# Patient Record
Sex: Male | Born: 1965 | Race: White | Hispanic: No | Smoking: Former smoker
Health system: Southern US, Community
[De-identification: ages and names within clinical notes are randomized; demographics above are authoritative.]

## PROBLEM LIST (undated history)

## (undated) DIAGNOSIS — F32A Depression, unspecified: Secondary | ICD-10-CM

## (undated) DIAGNOSIS — F909 Attention-deficit hyperactivity disorder, unspecified type: Secondary | ICD-10-CM

## (undated) DIAGNOSIS — F419 Anxiety disorder, unspecified: Secondary | ICD-10-CM

## (undated) HISTORY — DX: Anxiety disorder, unspecified: F41.9

## (undated) HISTORY — DX: Depression, unspecified: F32.A

## (undated) HISTORY — DX: Attention-deficit hyperactivity disorder, unspecified type: F90.9

---

## 2009-12-30 DIAGNOSIS — J309 Allergic rhinitis, unspecified: Secondary | ICD-10-CM | POA: Insufficient documentation

## 2011-12-07 DIAGNOSIS — H5210 Myopia, unspecified eye: Secondary | ICD-10-CM | POA: Insufficient documentation

## 2011-12-07 DIAGNOSIS — H524 Presbyopia: Secondary | ICD-10-CM | POA: Insufficient documentation

## 2012-02-10 DIAGNOSIS — G44209 Tension-type headache, unspecified, not intractable: Secondary | ICD-10-CM | POA: Insufficient documentation

## 2019-12-13 ENCOUNTER — Ambulatory Visit (INDEPENDENT_AMBULATORY_CARE_PROVIDER_SITE_OTHER): Payer: Self-pay

## 2020-08-08 ENCOUNTER — Encounter (INDEPENDENT_AMBULATORY_CARE_PROVIDER_SITE_OTHER): Payer: Self-pay | Admitting: Internal Medicine

## 2020-08-14 ENCOUNTER — Encounter (INDEPENDENT_AMBULATORY_CARE_PROVIDER_SITE_OTHER): Payer: Self-pay | Admitting: Internal Medicine

## 2020-08-14 DIAGNOSIS — F988 Other specified behavioral and emotional disorders with onset usually occurring in childhood and adolescence: Secondary | ICD-10-CM | POA: Insufficient documentation

## 2020-08-14 DIAGNOSIS — Z8739 Personal history of other diseases of the musculoskeletal system and connective tissue: Secondary | ICD-10-CM | POA: Insufficient documentation

## 2020-08-14 DIAGNOSIS — H04129 Dry eye syndrome of unspecified lacrimal gland: Secondary | ICD-10-CM | POA: Insufficient documentation

## 2020-08-14 DIAGNOSIS — M549 Dorsalgia, unspecified: Secondary | ICD-10-CM | POA: Insufficient documentation

## 2020-08-14 DIAGNOSIS — F32A Depression, unspecified: Secondary | ICD-10-CM | POA: Insufficient documentation

## 2020-08-16 ENCOUNTER — Ambulatory Visit (INDEPENDENT_AMBULATORY_CARE_PROVIDER_SITE_OTHER): Payer: BLUE CROSS/BLUE SHIELD | Admitting: Internal Medicine

## 2020-08-16 ENCOUNTER — Encounter (INDEPENDENT_AMBULATORY_CARE_PROVIDER_SITE_OTHER): Payer: Self-pay | Admitting: Internal Medicine

## 2020-08-16 VITALS — BP 122/80 | HR 73 | Temp 96.9°F | Resp 16 | Ht 68.0 in | Wt 143.0 lb

## 2020-08-16 DIAGNOSIS — Z1211 Encounter for screening for malignant neoplasm of colon: Secondary | ICD-10-CM

## 2020-08-16 DIAGNOSIS — Z0000 Encounter for general adult medical examination without abnormal findings: Secondary | ICD-10-CM

## 2020-08-16 DIAGNOSIS — Z125 Encounter for screening for malignant neoplasm of prostate: Secondary | ICD-10-CM

## 2020-08-16 DIAGNOSIS — M79671 Pain in right foot: Secondary | ICD-10-CM

## 2020-08-16 DIAGNOSIS — F419 Anxiety disorder, unspecified: Secondary | ICD-10-CM

## 2020-08-16 DIAGNOSIS — A630 Anogenital (venereal) warts: Secondary | ICD-10-CM

## 2020-08-16 DIAGNOSIS — F32A Depression, unspecified: Secondary | ICD-10-CM

## 2020-08-16 NOTE — Progress Notes (Signed)
Subjective:      Patient ID: Charles Davidson is a 54 y.o. male     Chief Complaint   Patient presents with    Foot Injury     right        HPI right foot eversion injury, took a wrong step in June which was about 3 to 4 months ago.  No immediate swelling noted but was having pain with weightbearing which has improved to 70 to 80%.  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  No fever or chills noted.  He wants to set up a colon cancer screening as his paternal grandmother had colon cancer.  Noted some nodule like growth in his perianal area for last few months they are painless.    The following sections were reviewed this encounter by the provider:   Tobacco   Allergies   Meds   Problems   Med Hx   Surg Hx   Fam Hx          Review of Systems   Constitutional: Negative.    HENT: Negative.    Eyes: Negative.    Respiratory: Negative.    Cardiovascular: Negative.    Gastrointestinal: Negative.    Endocrine: Negative.    Genitourinary: Negative.    Musculoskeletal: Negative.    Skin: Negative.    Neurological: Negative.    Psychiatric/Behavioral: Negative.           BP 122/80    Pulse 73    Temp (!) 96.9 F (36.1 C) (Tympanic)    Resp 16    Ht 1.727 m (5\' 8" )    Wt 64.9 kg (143 lb)    SpO2 97%    BMI 21.74 kg/m     Objective:     Physical Exam  Vitals reviewed.   Constitutional:       Appearance: Normal appearance.   HENT:      Head: Normocephalic and atraumatic.      Nose: Nose normal.      Mouth/Throat:      Mouth: Mucous membranes are moist.      Pharynx: Oropharynx is clear.   Eyes:      Extraocular Movements: Extraocular movements intact.      Conjunctiva/sclera: Conjunctivae normal.      Pupils: Pupils are equal, round, and reactive to light.   Cardiovascular:      Rate and Rhythm: Normal rate and regular rhythm.      Pulses: Normal pulses.      Heart sounds: Normal heart sounds.   Pulmonary:      Effort: Pulmonary effort is normal.      Breath sounds: Normal breath sounds.   Abdominal:       General: Bowel sounds are normal.      Palpations: Abdomen is soft.   Genitourinary:      Musculoskeletal:         General: Normal range of motion.      Cervical back: Normal range of motion and neck supple.      Right foot: Normal range of motion. No deformity, bunion or foot drop.        Feet:    Skin:     General: Skin is warm and dry.   Neurological:      General: No focal deficit present.      Mental Status: He is alert and oriented to person, place, and time. Mental status is at baseline.   Psychiatric:  Mood and Affect: Mood normal.         Behavior: Behavior normal.         Thought Content: Thought content normal.         Judgment: Judgment normal.          Assessment:     1. Colon cancer screening  - Ambulatory referral to Gastroenterology    2. Prostate cancer screening  - PROSTATE SPECIFIC ANTIGEN SCREEN; Future    3. Periodic health assessment, general screening, adult  - CBC and differential; Future  - Comprehensive metabolic panel  - Urinalysis with microscopic; Future  - Lipid panel; Future  - Hemoglobin A1C; Future    4. Anxiety and depression  - TSH; Future    5. Right foot pain  - Referral to Podiatry - EXTERNAL    6. Condyloma acuminatum  - Referral to Dermatology - EXTERNAL     Nonspecific right metatarsal area of discomfort basically resolved, needs to wear proper fitting and supportive shoes with good arch support.  Can benefit from podiatry evaluation if pain persist for next 3 months.  Anxiety and depression controlled with lifestyle changes and change of job.  Benign-appearing small polypoid viral warts in the perianal area will benefit from dermatology intervention.  Family history of colon cancer will benefit from colon cancer screening with colonoscopy.      Plan:     Podiatry evaluation as needed  Supportive shoes with good arch support.  Dermatology evaluation  Colon cancer screening  Schedule a full physical  Lab requested for physical.    Lewis Keats Jennell Corner, MD

## 2020-08-16 NOTE — Addendum Note (Signed)
Addended by: Fabio Neighbors PRAKASH on: 08/16/2020 11:18 AM     Modules accepted: Orders

## 2020-08-16 NOTE — Patient Instructions (Signed)
Podiatry evaluation as needed  Supportive shoes with good arch support.  Dermatology evaluation  Colon cancer screening  Schedule a full physical  Lab requested for physical.

## 2020-10-07 ENCOUNTER — Encounter (INDEPENDENT_AMBULATORY_CARE_PROVIDER_SITE_OTHER): Payer: Self-pay | Admitting: Internal Medicine

## 2020-10-07 ENCOUNTER — Ambulatory Visit (INDEPENDENT_AMBULATORY_CARE_PROVIDER_SITE_OTHER): Payer: BLUE CROSS/BLUE SHIELD | Admitting: Internal Medicine

## 2020-10-07 VITALS — BP 120/78 | HR 64 | Temp 97.9°F | Resp 14 | Ht 68.0 in | Wt 143.0 lb

## 2020-10-07 DIAGNOSIS — F988 Other specified behavioral and emotional disorders with onset usually occurring in childhood and adolescence: Secondary | ICD-10-CM

## 2020-10-07 DIAGNOSIS — Z23 Encounter for immunization: Secondary | ICD-10-CM

## 2020-10-07 DIAGNOSIS — Z0000 Encounter for general adult medical examination without abnormal findings: Secondary | ICD-10-CM

## 2020-10-07 DIAGNOSIS — J301 Allergic rhinitis due to pollen: Secondary | ICD-10-CM

## 2020-10-07 DIAGNOSIS — F419 Anxiety disorder, unspecified: Secondary | ICD-10-CM

## 2020-10-07 DIAGNOSIS — F32A Depression, unspecified: Secondary | ICD-10-CM

## 2020-10-07 DIAGNOSIS — Z125 Encounter for screening for malignant neoplasm of prostate: Secondary | ICD-10-CM

## 2020-10-07 NOTE — Patient Instructions (Signed)
Avoid lifting anything more than 15 pounds for 2 to 3 weeks.  Low sodium and low cholesterol diet  Exercise 3 times per week for 30 minutes  Follow up in 1 year      Shoulder and Arm Exercises: Elbow Extension/Triceps Press    This exercise stretches and strengthens your shoulders and triceps, the muscles in the back of your upper arm. Before starting this exercise, talk with your healthcare provider and read through all the instructions. During the exercise, breathe normally and use smooth movements. Stop if you feel any pain. If pain persists, call your healthcare provider.   Grasp a___ pound weight in each hand. Raise one arm overhead. Hold that arm close to your ear. Bend your elbow and lower the weight behind your head, as far as you can, being careful not to hit your head.Ask your physical therapist or healthcare provider how much weight to use.   Slowly straighten your elbow, extending your arm upward. Return to starting position.   Repeat ____ times with each arm. Do ____ sets ____ times a day.  Caution  Keep your head still and neck straight. Keep your arm close to your ear. Dont arch your back.  [employer] last reviewed this educational content on 05/03/2019   2000-2021 [employer], [place]. All rights reserved. This information is not intended as a substitute for professional medical care. Always follow your healthcare professional's instructions.

## 2020-10-07 NOTE — Progress Notes (Signed)
[facility] INTERNAL MEDICINE - [facility] [place] [facility]                  Date of Exam: 10/07/2020 11:03 AM        Patient ID: Charles Davidson is a 54 y.o. male.        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  Felt some pain in his left tricep area for a while, able to do all activities.         Problem List:    Patient Active Problem List   Diagnosis    Anxiety and depression    Attention deficit disorder (ADD) in adult    Dry eye    History of gout    Trigger point with back pain    Allergic rhinitis    Myopia    Presbyopia    Tension type headache             Current Meds:    No outpatient medications have been marked as taking for the 10/07/20 encounter (Office Visit) with Kaleen Odea, MD.          Allergies:    No Known Allergies          Past Surgical History:    Past Surgical History:   Procedure Laterality Date    MOUTH SURGERY  12/2019    replace bridge           Family History:    Family History   Problem Relation Age of Onset    Prostate cancer Father     Colon cancer Maternal Grandmother     Diabetes Paternal Grandmother     Heart attack Paternal Grandfather            Social History:    Social History     Socioeconomic History    Marital status: Divorced     Spouse name: None    Number of children: None    Years of education: None    Highest education level: None   Occupational History    None   Tobacco Use    Smoking  status: Former Smoker     Years: 8.00     Types: Cigarettes     Quit date: 2002     Years since quitting: 19.9    Smokeless tobacco: Never Used    Tobacco comment: 8 years 2-3 cigs/day   Vaping Use    Vaping Use: Never used   Substance and Sexual Activity    Alcohol use: Yes     Alcohol/week: 5.0 standard drinks     Types: 5 Glasses of wine per week    Drug use: Never    Sexual activity: Yes     Partners: Female     Birth control/protection: Condom   Other Topics Concern  None   Social History Narrative    Nutrition:  Well balanced diet    Caffeine: coffee , tea   and 1 /day    Seat  Belt use:  Yes    Smoke Dectector in the home:  Yes    Pets:  none                     Social Determinants of Psychologist, prison and probation services Strain:     Difficulty of Paying Living Expenses: Not on file   Food Insecurity:     Worried About Programme researcher, broadcasting/film/video in the Last Year: Not on file    [employer] of Food in the Last Year: Not on file   Transportation Needs:     Lack of Transportation (Medical): Not on file    Lack of Transportation (Non-Medical): Not on file   Physical Activity: Unknown    Days of Exercise per Week: 1 day    Minutes of Exercise per Session: Not on file   Stress:     Feeling of Stress : Not on file   Social Connections:     Frequency of Communication with Friends and Family: Not on file    Frequency of Social Gatherings with Friends and Family: Not on file    Attends Religious Services: Not on file    Active Member of Clubs or Organizations: Not on file    Attends Banker Meetings: Not on file    Marital Status: Not on file   Intimate Partner Violence:     Fear of Current or Ex-Partner: Not on file    Emotionally Abused: Not on file    Physically Abused: Not on file    Sexually Abused: Not on file   Housing Stability:     Unable to Pay for Housing in the Last Year: Not on file    Number of Places Lived in the Last Year: Not on file    Unstable Housing in the Last Year: Not on  file           The following sections were reviewed this encounter by the provider:   Tobacco   Allergies   Meds   Problems   Med Hx   Surg Hx   Fam Hx              Vital Signs:    BP 120/78    Pulse 64    Temp 97.9 F (36.6 C) (Tympanic)    Resp 14    Ht 1.727 m (5\' 8" )    Wt 64.9 kg (143 lb)    SpO2 97%    BMI 21.74 kg/m          ROS:    Review of Systems     General/Constitutional:           Denies Chills.  Denies Fatigue.  Denies Fever.       Ophthalmologic:           Denies Blurred vision.       ENT:           Denies Nasal Discharge.  Denies Sinus pain.  Denies Sore throat.       Endocrine:           Denies Polydipsia.  Denies Polyuria.       Respiratory:           Denies Cough.  Denies Orthopnea.  Denies Shortness of breath.  Denies Wheezing.       Cardiovascular:           Denies Chest pain.  Denies Chest pain with exertion.  Denies Palpitations.  Denies Swelling in hands/feet.       Gastrointestinal:           Denies Abdominal pain.  Denies Constipation.  Denies Diarrhea.  Denies Nausea.  Denies Vomiting.       Hematology:           Denies Easy bruising.  Denies Easy Bleeding.       Genitourinary:           Denies Blood in urine.  Denies Painful urination.       Peripheral Vascular:           Denies Pain/cramping in legs after exertion.  Denies Painful extremities.       Skin:           Denies Rash.       Neurologic:           Denies Dizziness.  Denies Pre-Syncope.  Denies Tingling/Numbness.       Psychiatric:           Denies Anxiety.  Denies Depressed mood.               Physical Exam:    Physical Exam   GENERAL APPEARANCE: alert, in no acute distress, well developed, well nourished, oriented to time, place, and person.   HEAD: normal appearance.   EYES: extraocular movement intact (EOMI), pupils equal, round, reactive to light and accommodation, sclera anicteric.   EARS: tympanic membranes normal bilaterally, external canals normal .   NOSE: normal nasal mucosa, nares patent.   ORAL CAVITY: normal  oropharynx.   THROAT: no erythema, no exudate.   NECK/THYROID: neck supple, carotid pulse 2+ bilaterally, no lymphadenopathy, no thyromegaly.   SKIN: no rashes.   HEART: S1, S2 normal, no murmurs, rubs, gallops, regular rate and rhythm.   LUNGS: normal effort / no distress, normal breath sounds, clear to auscultation bilaterally, no wheezes, rales, rhonchi.   ABDOMEN: bowel sounds present, no hepatosplenomegaly, soft, nontender, nondistended.   EXTREMITIES: no clubbing, cyanosis, or edema.   PERIPHERAL PULSES: 2+ dorsalis pedis, 2+ posterior tibial.   NEUROLOGIC: nonfocal, cranial nerves 2-12 grossly intact, deep tendon reflexes 2+ symmetrical, normal strength, tone and reflexes, sensory exam intact.   PSYCH: alert, oriented, cognitive function intact.  Genital exam is normal  Prostate exam is normal  Stool guaac is negative  Small polypoid-like lesions in the perianal area.            Assessment/Plan:    Health Maintenance:  Recommend optimizing low carbohydrate diet efforts and obtaining at least 150 minutes of aerobic exercise per week. Recommend 20-25 grams of dietary fiber daily. Recommend drinking at least 60-80 ounces of water per day. Recommend optimizing low sodium diet measures ( less than 2 grams of sodium in the diet per day ). Recommend obtaining a Shingles vaccination.  Vision screening UTD. Dental Screening UTD. Colonoscopy is due.  GI consult for colonoscopy ordered. Prostate cancer screening is due.  PSA ordered.  Problem List Items Addressed This Visit     Anxiety and depression    Attention deficit disorder (ADD) in adult    Allergic rhinitis      Other Visit Diagnoses     Periodic health assessment, general screening, adult    -  Primary    Relevant Orders  CBC and differential    Comprehensive metabolic panel    Urinalysis with microscopic    Lipid panel    Hemoglobin A1C    Prostate cancer screening        Relevant Orders    PROSTATE SPECIFIC ANTIGEN SCREEN    Need for immunization against  influenza        Relevant Orders    Flu vaccine QUADRIVALENT (PF) 6 months and older (FLULAVAL/FLUARIX)    Need for zoster vaccination        Relevant Orders    Zoster Vaccine Recomb,Adjuvanted (IM)    Need for immunization against tetanus alone        Relevant Orders    Tdap vaccine greater than or equal to 7yo IM      ADHD, anxiety and depression are fairly controlled with lifestyle modification and exercise.  Left triceps strain will benefit from stretch exercises and avoidance of heavy lifting or using mechanical equipment for at least 3 to 4 weeks.  Perianal condyloma seen by dermatology prescribed topical medicine.  Colon cancer screening referral provided during the last visit.  He wants to come back for Shingrix and Tdap vaccination.    Plan  Avoid lifting anything more than 15 pounds for 2 to 3 weeks.  Low sodium and low cholesterol diet  Exercise 3 times per week for 30 minutes  Follow up in 1 year                Follow-up:    Return in about 1 year (around 10/07/2021), or if symptoms worsen or fail to improve.         Jomayra Novitsky Jennell Corner, MD

## 2020-10-08 LAB — CBC AND DIFFERENTIAL
Baso(Absolute): 0 10*3/uL (ref 0.0–0.2)
Basos: 1 %
Eos: 2 %
Eosinophils Absolute: 0.1 10*3/uL (ref 0.0–0.4)
Hematocrit: 51.5 % — ABNORMAL HIGH (ref 37.5–51.0)
Hemoglobin: 17.2 g/dL (ref 13.0–17.7)
Immature Granulocytes Absolute: 0 10*3/uL (ref 0.0–0.1)
Immature Granulocytes: 0 %
Lymphocytes Absolute: 1.6 10*3/uL (ref 0.7–3.1)
Lymphocytes: 31 %
MCH: 31.1 pg (ref 26.6–33.0)
MCHC: 33.4 g/dL (ref 31.5–35.7)
MCV: 93 fL (ref 79–97)
Monocytes Absolute: 0.7 10*3/uL (ref 0.1–0.9)
Monocytes: 13 %
Neutrophils Absolute: 2.7 10*3/uL (ref 1.4–7.0)
Neutrophils: 53 %
Platelets: 250 10*3/uL (ref 150–450)
RBC: 5.53 x10E6/uL (ref 4.14–5.80)
RDW: 11.4 % — ABNORMAL LOW (ref 11.6–15.4)
WBC: 5 10*3/uL (ref 3.4–10.8)

## 2020-10-08 LAB — URINALYSIS WITH MICROSCOPIC
Bilirubin, UA: NEGATIVE
Blood, UA: NEGATIVE
Glucose, UA: NEGATIVE
Ketones UA: NEGATIVE
Leukocyte Esterase, UA: NEGATIVE
Nitrite, UA: NEGATIVE
Protein, UA: NEGATIVE
Urine Specific Gravity: 1.024 (ref 1.005–1.030)
Urobilinogen, Ur: 0.2 mg/dL (ref 0.2–1.0)
pH, UA: 5.5 (ref 5.0–7.5)

## 2020-10-08 LAB — LIPID PANEL
Cholesterol / HDL Ratio: 3.2 ratio (ref 0.0–5.0)
Cholesterol: 212 mg/dL — ABNORMAL HIGH (ref 100–199)
HDL: 66 mg/dL (ref >39)
LDL Chol Calculated (NIH): 134 mg/dL — ABNORMAL HIGH (ref 0–99)
Triglycerides: 69 mg/dL (ref 0–149)
VLDL Calculated: 12 mg/dL (ref 5–40)

## 2020-10-08 LAB — COMPREHENSIVE METABOLIC PANEL
ALT: 27 IU/L (ref 0–44)
AST (SGOT): 23 IU/L (ref 0–40)
African American eGFR: 89 mL/min/{1.73_m2} (ref >59)
Albumin/Globulin Ratio: 2.1 (ref 1.2–2.2)
Albumin: 5 g/dL — ABNORMAL HIGH (ref 3.8–4.9)
Alkaline Phosphatase: 74 IU/L (ref 44–121)
BUN / Creatinine Ratio: 20 (ref 9–20)
BUN: 22 mg/dL (ref 6–24)
Bilirubin, Total: 0.8 mg/dL (ref 0.0–1.2)
CO2: 24 mmol/L (ref 20–29)
Calcium: 9.2 mg/dL (ref 8.7–10.2)
Chloride: 103 mmol/L (ref 96–106)
Creatinine: 1.09 mg/dL (ref 0.76–1.27)
Globulin, Total: 2.4 g/dL (ref 1.5–4.5)
Glucose: 97 mg/dL (ref 65–99)
Potassium: 4.6 mmol/L (ref 3.5–5.2)
Protein, Total: 7.4 g/dL (ref 6.0–8.5)
Sodium: 140 mmol/L (ref 134–144)
non-African American eGFR: 77 mL/min/{1.73_m2} (ref >59)

## 2020-10-08 LAB — PROSTATE SPECIFIC ANTIGEN SCREEN: Prostate Specific Antigen, Total: 1 ng/mL (ref 0.0–4.0)

## 2020-10-08 LAB — HEMOGLOBIN A1C: Hemoglobin A1C: 5.7 % — ABNORMAL HIGH (ref 4.8–5.6)

## 2020-10-08 LAB — REFLEX - MICROSCOPIC EXAMINATION
Bacteria, UA: NONE SEEN
Casts, UA: NONE SEEN /lpf
Epithelial Cells (non renal): NONE SEEN /hpf (ref 0–10)
RBC, UA: NONE SEEN /hpf (ref 0–2)

## 2020-10-08 NOTE — Progress Notes (Signed)
Dear  Mr. Ladona Ridgel,    The recent lab results  Show the following :    : Overall everything looks ok.      A1C (3 month measure of glucose control) is within normal limits.  CBC (complete blood count) is within acceptable limits.  There is no evidence of anemia.  White blood cell count and platelet count are within normal limits.    CMP (comprehensive metabolic panel): liver function, kidney function, electrolytes, and glucose are within acceptable limits.    TSH (thyroid function) is within acceptable limits.     Lipid panel: total cholesterol, LDL (bad), triglycerides, and HDL (good) are within acceptable limits.    The urinalysis is within acceptable limits; there is no evidence of clinically significant amounts of white blood cells, blood, or protein in the urine.  PSA (prostate test) is within acceptable limits.      Fabio Neighbors MD    [phone] [address]  [address]  [place],  [place] [address]  Telephone [phone]

## 2020-12-03 ENCOUNTER — Telehealth (INDEPENDENT_AMBULATORY_CARE_PROVIDER_SITE_OTHER): Payer: Self-pay | Admitting: Internal Medicine

## 2020-12-03 NOTE — Telephone Encounter (Signed)
He needs to make an in person appointment with me for evaluation.      Fabio Neighbors MD    [phone] [address]  [address]  [place],  [place] [address]  Telephone [phone]

## 2020-12-03 NOTE — Telephone Encounter (Signed)
Pt called stating he would like an order to see a specialist for his arm pain at the injection site of his vaccination. He can be reached at [phone] if you need to speak with him.
